# Patient Record
Sex: Female | Born: 1985 | Race: Black or African American | Hispanic: No | Marital: Single | Smoking: Former smoker
Health system: Southern US, Community
[De-identification: ages and names within clinical notes are randomized; demographics above are authoritative.]

## PROBLEM LIST (undated history)

## (undated) DIAGNOSIS — A599 Trichomoniasis, unspecified: Secondary | ICD-10-CM

## (undated) DIAGNOSIS — J40 Bronchitis, not specified as acute or chronic: Secondary | ICD-10-CM

## (undated) DIAGNOSIS — R51 Headache: Secondary | ICD-10-CM

## (undated) DIAGNOSIS — Z8619 Personal history of other infectious and parasitic diseases: Secondary | ICD-10-CM

## (undated) DIAGNOSIS — A749 Chlamydial infection, unspecified: Secondary | ICD-10-CM

## (undated) HISTORY — PX: NO PAST SURGERIES: SHX2092

---
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
## 2008-03-16 ENCOUNTER — Emergency Department (HOSPITAL_COMMUNITY): Admission: EM | Admit: 2008-03-16 | Discharge: 2008-03-16 | Payer: Self-pay | Admitting: Emergency Medicine

## 2008-05-09 ENCOUNTER — Emergency Department (HOSPITAL_COMMUNITY): Admission: EM | Admit: 2008-05-09 | Discharge: 2008-05-09 | Payer: Self-pay | Admitting: Emergency Medicine

## 2009-08-10 ENCOUNTER — Emergency Department (HOSPITAL_COMMUNITY): Admission: EM | Admit: 2009-08-10 | Discharge: 2009-08-11 | Payer: Self-pay | Admitting: Emergency Medicine

## 2010-05-30 ENCOUNTER — Emergency Department (HOSPITAL_COMMUNITY): Admission: EM | Admit: 2010-05-30 | Discharge: 2010-05-30 | Payer: Self-pay | Admitting: Emergency Medicine

## 2010-09-22 ENCOUNTER — Emergency Department (HOSPITAL_COMMUNITY): Admission: EM | Admit: 2010-09-22 | Discharge: 2010-05-18 | Payer: Self-pay | Admitting: Emergency Medicine

## 2010-09-23 ENCOUNTER — Emergency Department (HOSPITAL_COMMUNITY)
Admission: EM | Admit: 2010-09-23 | Discharge: 2010-09-23 | Payer: Self-pay | Source: Home / Self Care | Admitting: Emergency Medicine

## 2010-10-16 NOTE — L&D Delivery Note (Signed)
Delivery Note At 4:21 AM a viable female was delivered via Vaginal, Spontaneous Delivery (Presentation: Left Occiput Anterior).  APGAR: 9, 9; weight 7 lb 2 oz (3232 g).   Placenta status: Intact, Spontaneous.  Cord: 3 vessels with the following complications: none.  Anesthesia: Epidural  Episiotomy: none Lacerations: 2nd degree Suture Repair: 2.0 3.0 Est. Blood Loss (mL): 300 ml  Mom to postpartum.  Baby to nursery-stable.  JACKSON-MOORE,Jacorie Ernsberger A 08/21/2011, 5:10 AM

## 2010-12-26 LAB — STREP A DNA PROBE

## 2011-01-19 LAB — URINE MICROSCOPIC-ADD ON

## 2011-01-19 LAB — URINE CULTURE: Colony Count: >=100000

## 2011-01-19 LAB — URINALYSIS, ROUTINE W REFLEX MICROSCOPIC
Glucose, UA: NEGATIVE mg/dL
Ketones, ur: NEGATIVE mg/dL
Nitrite: NEGATIVE
Protein, ur: 100 mg/dL — AB

## 2011-01-19 LAB — POCT PREGNANCY, URINE: Preg Test, Ur: NEGATIVE

## 2011-02-14 ENCOUNTER — Other Ambulatory Visit: Payer: Self-pay

## 2011-02-14 LAB — ABO/RH: RH Type: POSITIVE

## 2011-02-23 ENCOUNTER — Emergency Department (HOSPITAL_COMMUNITY)
Admission: EM | Admit: 2011-02-23 | Discharge: 2011-02-23 | Disposition: A | Discharge status: Home / Self Care | Payer: Medicaid Other | Attending: Emergency Medicine | Admitting: Emergency Medicine

## 2011-02-23 DIAGNOSIS — R51 Headache: Secondary | ICD-10-CM | POA: Insufficient documentation

## 2011-02-23 DIAGNOSIS — O99891 Other specified diseases and conditions complicating pregnancy: Secondary | ICD-10-CM | POA: Insufficient documentation

## 2011-02-23 DIAGNOSIS — O210 Mild hyperemesis gravidarum: Secondary | ICD-10-CM | POA: Insufficient documentation

## 2011-02-23 LAB — COMPREHENSIVE METABOLIC PANEL
AST: 16 U/L (ref 0–37)
CO2: 22 mEq/L (ref 19–32)
Calcium: 10.5 mg/dL (ref 8.4–10.5)
Chloride: 100 mEq/L (ref 96–112)
Creatinine, Ser: 0.5 mg/dL (ref 0.4–1.2)
GFR calc Af Amer: >60 mL/min (ref >60)
GFR calc non Af Amer: >60 mL/min (ref >60)
Glucose, Bld: 75 mg/dL (ref 70–99)
Total Bilirubin: 0.2 mg/dL — ABNORMAL LOW (ref 0.3–1.2)

## 2011-02-23 LAB — URINALYSIS, ROUTINE W REFLEX MICROSCOPIC
Bilirubin Urine: NEGATIVE
Hgb urine dipstick: NEGATIVE
Ketones, ur: NEGATIVE mg/dL
Nitrite: NEGATIVE
Protein, ur: NEGATIVE mg/dL
Urobilinogen, UA: 0.2 mg/dL (ref 0.0–1.0)

## 2011-03-28 DIAGNOSIS — M549 Dorsalgia, unspecified: Secondary | ICD-10-CM

## 2011-03-28 DIAGNOSIS — O26899 Other specified pregnancy related conditions, unspecified trimester: Secondary | ICD-10-CM

## 2011-07-06 ENCOUNTER — Inpatient Hospital Stay (HOSPITAL_COMMUNITY)
Admission: AD | Admit: 2011-07-06 | Discharge: 2011-07-06 | Disposition: A | Discharge status: Home / Self Care | Payer: Medicaid Other | Source: Ambulatory Visit | Attending: Obstetrics & Gynecology | Admitting: Obstetrics & Gynecology

## 2011-07-06 ENCOUNTER — Encounter (HOSPITAL_COMMUNITY): Payer: Self-pay | Admitting: *Deleted

## 2011-07-06 DIAGNOSIS — R109 Unspecified abdominal pain: Secondary | ICD-10-CM | POA: Insufficient documentation

## 2011-07-06 DIAGNOSIS — O99891 Other specified diseases and conditions complicating pregnancy: Secondary | ICD-10-CM | POA: Insufficient documentation

## 2011-07-06 DIAGNOSIS — M549 Dorsalgia, unspecified: Secondary | ICD-10-CM | POA: Insufficient documentation

## 2011-07-06 HISTORY — DX: Headache: R51

## 2011-07-06 HISTORY — DX: Personal history of other infectious and parasitic diseases: Z86.19

## 2011-07-06 HISTORY — DX: Trichomoniasis, unspecified: A59.9

## 2011-07-06 LAB — URINALYSIS, ROUTINE W REFLEX MICROSCOPIC
Bilirubin Urine: NEGATIVE
Ketones, ur: NEGATIVE mg/dL
Leukocytes, UA: NEGATIVE
Nitrite: NEGATIVE
Specific Gravity, Urine: 1.01 (ref 1.005–1.030)
Urobilinogen, UA: 0.2 mg/dL (ref 0.0–1.0)

## 2011-07-06 MED ORDER — CYCLOBENZAPRINE HCL 10 MG PO TABS: 10.0000 mg | ORAL_TABLET | Freq: Three times a day (TID) | ORAL | Status: AC | PRN

## 2011-07-06 MED ORDER — ACETAMINOPHEN-CODEINE #3 300-30 MG PO TABS: 1.0000 | ORAL_TABLET | ORAL | Status: AC | PRN

## 2011-07-06 NOTE — ED Provider Notes (Signed)
History     Chief Complaint  Patient presents with  . Abdominal Pain   HPI Right side pain since 1700 tonight, worse with sitting straight up. Currently in left lateral position and not feeling pain at all.   OB History    Grav Para Term Preterm Abortions TAB SAB Ect Mult Living   1               Past Medical History  Diagnosis Date  . History of chlamydia infection     Treated  . Trichomonas infection     Treated  . Headache   . No pertinent past medical history     Past Surgical History  Procedure Date  . Cosmetic surgery 1997    Facial laceration repair   . No past surgeries     No family history on file.  History  Substance Use Topics  . Smoking status: Former Smoker -- 5 years    Types: Cigarettes    Quit date: 01/03/2011  . Smokeless tobacco: Not on file  . Alcohol Use: No     Prepregnancy    Allergies: No Known Allergies  Prescriptions prior to admission  Medication Sig Dispense Refill  . fluconazole (DIFLUCAN) 200 MG tablet Take 200 mg by mouth daily.        Marland Kitchen omeprazole (PRILOSEC) 20 MG capsule Take 20 mg by mouth daily.        . prenatal vitamin w/FE, FA (PRENATAL 1 + 1) 27-1 MG TABS Take 1 tablet by mouth daily.          Review of Systems  Constitutional: Negative.   Respiratory: Negative.   Cardiovascular: Negative.   Gastrointestinal: Negative for nausea, vomiting, abdominal pain, diarrhea and constipation.  Genitourinary: Negative for dysuria, urgency, frequency, hematuria and flank pain.       Negative for vaginal bleeding, cramping/contractions  Musculoskeletal: Positive for back pain.  Neurological: Negative.   Psychiatric/Behavioral: Negative.    Physical Exam   Blood pressure 108/49, pulse 85, temperature 98.3 F (36.8 C), temperature source Oral, resp. rate 16, height 5\' 5"  (1.651 m), weight 97.07 kg (214 lb), last menstrual period 11/11/2010.  Physical Exam  Nursing note and vitals reviewed. Constitutional: She is oriented  to person, place, and time. She appears well-developed and well-nourished. No distress.  HENT:  Head: Normocephalic and atraumatic.  Cardiovascular: Normal rate.   Respiratory: Effort normal.  GI: Soft. Bowel sounds are normal. She exhibits no mass. There is no tenderness. There is no rebound, no guarding and no CVA tenderness.  Genitourinary: There is no rash or lesion on the right labia. There is no rash or lesion on the left labia. Uterus is not tender. Enlarged: Size c/w dates. Cervix exhibits no motion tenderness, no discharge and no friability. Right adnexum displays no mass, no tenderness and no fullness. Left adnexum displays no mass, no tenderness and no fullness. No tenderness or bleeding around the vagina. No vaginal discharge found.  Musculoskeletal: Normal range of motion.  Neurological: She is alert and oriented to person, place, and time.  Skin: Skin is warm and dry.  Psychiatric: She has a normal mood and affect.    MAU Course  Procedures Results for orders placed during the hospital encounter of 07/06/11 (from the past 24 hour(s))  URINALYSIS, ROUTINE W REFLEX MICROSCOPIC     Status: Normal   Collection Time   07/06/11  7:35 PM      Component Value Range   Color, Urine YELLOW  YELLOW    Appearance CLEAR  CLEAR    Specific Gravity, Urine 1.010  1.005 - 1.030    pH 6.5  5.0 - 8.0    Glucose, UA NEGATIVE  NEGATIVE (mg/dL)   Hgb urine dipstick NEGATIVE  NEGATIVE    Bilirubin Urine NEGATIVE  NEGATIVE    Ketones, ur NEGATIVE  NEGATIVE (mg/dL)   Protein, ur NEGATIVE  NEGATIVE (mg/dL)   Urobilinogen, UA 0.2  0.0 - 1.0 (mg/dL)   Nitrite NEGATIVE  NEGATIVE    Leukocytes, UA NEGATIVE  NEGATIVE      Assessment and Plan  25 y.o. G1P0 at [redacted]w[redacted]d Back pain - likely MSK in origin - rx tylenol #3 and Flexeril, rev'd comfort measures and stretches Precautions rev'd F/u as scheduled  Alasdair Kleve 07/06/2011, 8:57 PM

## 2011-07-06 NOTE — Progress Notes (Signed)
Pt reports R side pain that radiates to her back and abdomen at times. Pt stated that pain started 17:00. Pt rates pain @ 5/10 now, but was up to an 8/10 at times.

## 2011-07-14 LAB — URINALYSIS, ROUTINE W REFLEX MICROSCOPIC
Bilirubin Urine: NEGATIVE
Hgb urine dipstick: NEGATIVE
Nitrite: NEGATIVE
Protein, ur: 100 — AB
Urobilinogen, UA: 0.2

## 2011-07-14 LAB — POCT PREGNANCY, URINE
Operator id: 24446
Preg Test, Ur: NEGATIVE

## 2011-07-18 LAB — STREP B DNA PROBE: GBS: POSITIVE

## 2011-07-26 ENCOUNTER — Ambulatory Visit: Payer: Medicaid Other | Admitting: Cardiovascular Disease

## 2011-08-03 ENCOUNTER — Other Ambulatory Visit: Payer: Self-pay | Admitting: Obstetrics & Gynecology

## 2011-08-03 DIAGNOSIS — IMO0002 Reserved for concepts with insufficient information to code with codable children: Secondary | ICD-10-CM

## 2011-08-04 ENCOUNTER — Ambulatory Visit (HOSPITAL_COMMUNITY)
Admission: RE | Admit: 2011-08-04 | Discharge: 2011-08-04 | Disposition: A | Discharge status: Home / Self Care | Payer: Medicaid Other | Source: Ambulatory Visit | Attending: Obstetrics & Gynecology | Admitting: Obstetrics & Gynecology

## 2011-08-04 ENCOUNTER — Encounter (HOSPITAL_COMMUNITY): Payer: Self-pay

## 2011-08-04 DIAGNOSIS — O368390 Maternal care for abnormalities of the fetal heart rate or rhythm, unspecified trimester, not applicable or unspecified: Secondary | ICD-10-CM | POA: Insufficient documentation

## 2011-08-04 DIAGNOSIS — IMO0002 Reserved for concepts with insufficient information to code with codable children: Secondary | ICD-10-CM

## 2011-08-04 DIAGNOSIS — Z363 Encounter for antenatal screening for malformations: Secondary | ICD-10-CM | POA: Insufficient documentation

## 2011-08-04 DIAGNOSIS — Z1389 Encounter for screening for other disorder: Secondary | ICD-10-CM | POA: Insufficient documentation

## 2011-08-04 DIAGNOSIS — O358XX0 Maternal care for other (suspected) fetal abnormality and damage, not applicable or unspecified: Secondary | ICD-10-CM | POA: Insufficient documentation

## 2011-08-20 ENCOUNTER — Encounter (HOSPITAL_COMMUNITY): Payer: Self-pay

## 2011-08-20 ENCOUNTER — Inpatient Hospital Stay (HOSPITAL_COMMUNITY)
Admission: AD | Admit: 2011-08-20 | Discharge: 2011-08-20 | Disposition: A | Discharge status: Home / Self Care | Payer: Medicaid Other | Source: Ambulatory Visit | Attending: Obstetrics & Gynecology | Admitting: Obstetrics & Gynecology

## 2011-08-20 DIAGNOSIS — O479 False labor, unspecified: Secondary | ICD-10-CM | POA: Insufficient documentation

## 2011-08-20 HISTORY — DX: Bronchitis, not specified as acute or chronic: J40

## 2011-08-20 HISTORY — DX: Chlamydial infection, unspecified: A74.9

## 2011-08-20 NOTE — Progress Notes (Signed)
Patient is here with c/o ctx q34m since 1am and no fetal movement. Denies lof, vaginal bleeding or discharge.

## 2011-08-20 NOTE — Progress Notes (Signed)
Pt states, " I've had contractions off and on for a couple days, but since 0150 they have been regular and stronger."

## 2011-08-21 ENCOUNTER — Encounter (HOSPITAL_COMMUNITY): Payer: Self-pay | Admitting: Anesthesiology

## 2011-08-21 ENCOUNTER — Inpatient Hospital Stay (HOSPITAL_COMMUNITY)
Admission: AD | Admit: 2011-08-21 | Discharge: 2011-08-23 | DRG: 775 | Disposition: A | Discharge status: Home / Self Care | Payer: Medicaid Other | Source: Ambulatory Visit | Attending: Obstetrics & Gynecology | Admitting: Obstetrics & Gynecology

## 2011-08-21 ENCOUNTER — Encounter (HOSPITAL_COMMUNITY): Payer: Self-pay | Admitting: *Deleted

## 2011-08-21 ENCOUNTER — Inpatient Hospital Stay (HOSPITAL_COMMUNITY): Payer: Medicaid Other | Admitting: Anesthesiology

## 2011-08-21 DIAGNOSIS — O99892 Other specified diseases and conditions complicating childbirth: Secondary | ICD-10-CM | POA: Diagnosis present

## 2011-08-21 DIAGNOSIS — IMO0001 Reserved for inherently not codable concepts without codable children: Secondary | ICD-10-CM

## 2011-08-21 DIAGNOSIS — Z22330 Carrier of Group B streptococcus: Secondary | ICD-10-CM

## 2011-08-21 LAB — CBC
HCT: 40.8 % (ref 36.0–46.0)
MCH: 29.3 pg (ref 26.0–34.0)
MCV: 87.4 fL (ref 78.0–100.0)
Platelets: 186 10*3/uL (ref 150–400)
RBC: 4.67 MIL/uL (ref 3.87–5.11)
WBC: 10.7 10*3/uL — ABNORMAL HIGH (ref 4.0–10.5)

## 2011-08-21 MED ORDER — BENZOCAINE-MENTHOL 20-0.5 % EX AERO
INHALATION_SPRAY | CUTANEOUS | Status: AC
  Administered 2011-08-21: 1 via TOPICAL
  Filled 2011-08-21: qty 56

## 2011-08-21 MED ORDER — WITCH HAZEL-GLYCERIN EX PADS: 1.0000 "application " | MEDICATED_PAD | CUTANEOUS | Status: DC | PRN

## 2011-08-21 MED ORDER — FENTANYL 2.5 MCG/ML BUPIVACAINE 1/10 % EPIDURAL INFUSION (WH - ANES)
14.0000 mL/h | INTRAMUSCULAR | Status: DC
  Administered 2011-08-21: 14 mL/h via EPIDURAL
  Filled 2011-08-21: qty 60

## 2011-08-21 MED ORDER — TETANUS-DIPHTH-ACELL PERTUSSIS 5-2.5-18.5 LF-MCG/0.5 IM SUSP
0.5000 mL | Freq: Once | INTRAMUSCULAR | Status: AC
  Administered 2011-08-22: 0.5 mL via INTRAMUSCULAR
  Filled 2011-08-21: qty 0.5

## 2011-08-21 MED ORDER — DIBUCAINE 1 % RE OINT: 1.0000 "application " | TOPICAL_OINTMENT | RECTAL | Status: DC | PRN

## 2011-08-21 MED ORDER — FERROUS SULFATE 325 (65 FE) MG PO TABS
325.0000 mg | ORAL_TABLET | Freq: Two times a day (BID) | ORAL | Status: DC
  Administered 2011-08-21 – 2011-08-23 (×5): 325 mg via ORAL
  Filled 2011-08-21 (×5): qty 1

## 2011-08-21 MED ORDER — EPHEDRINE 5 MG/ML INJ
10.0000 mg | INTRAVENOUS | Status: DC | PRN
  Filled 2011-08-21 (×2): qty 4

## 2011-08-21 MED ORDER — ZOLPIDEM TARTRATE 5 MG PO TABS: 5.0000 mg | ORAL_TABLET | Freq: Every evening | ORAL | Status: DC | PRN

## 2011-08-21 MED ORDER — MAGNESIUM HYDROXIDE 400 MG/5ML PO SUSP: 30.0000 mL | ORAL | Status: DC | PRN

## 2011-08-21 MED ORDER — LANOLIN HYDROUS EX OINT: TOPICAL_OINTMENT | CUTANEOUS | Status: DC | PRN

## 2011-08-21 MED ORDER — ONDANSETRON HCL 4 MG/2ML IJ SOLN: 4.0000 mg | INTRAMUSCULAR | Status: DC | PRN

## 2011-08-21 MED ORDER — PENICILLIN G POTASSIUM 5000000 UNITS IJ SOLR
2.5000 10*6.[IU] | INTRAVENOUS | Status: DC
  Filled 2011-08-21 (×3): qty 2.5

## 2011-08-21 MED ORDER — DIPHENHYDRAMINE HCL 50 MG/ML IJ SOLN: 12.5000 mg | INTRAMUSCULAR | Status: DC | PRN

## 2011-08-21 MED ORDER — LACTATED RINGERS IV SOLN: 500.0000 mL | Freq: Once | INTRAVENOUS | Status: DC

## 2011-08-21 MED ORDER — IBUPROFEN 600 MG PO TABS
600.0000 mg | ORAL_TABLET | Freq: Four times a day (QID) | ORAL | Status: DC
  Administered 2011-08-21 – 2011-08-23 (×9): 600 mg via ORAL
  Filled 2011-08-21 (×10): qty 1

## 2011-08-21 MED ORDER — MEDROXYPROGESTERONE ACETATE 150 MG/ML IM SUSP: 150.0000 mg | INTRAMUSCULAR | Status: DC | PRN

## 2011-08-21 MED ORDER — LACTATED RINGERS IV SOLN: INTRAVENOUS | Status: DC

## 2011-08-21 MED ORDER — LIDOCAINE HCL (PF) 1 % IJ SOLN
30.0000 mL | INTRAMUSCULAR | Status: DC | PRN
  Filled 2011-08-21 (×2): qty 30

## 2011-08-21 MED ORDER — DIPHENHYDRAMINE HCL 25 MG PO CAPS: 25.0000 mg | ORAL_CAPSULE | Freq: Four times a day (QID) | ORAL | Status: DC | PRN

## 2011-08-21 MED ORDER — MEASLES, MUMPS & RUBELLA VAC ~~LOC~~ INJ
0.5000 mL | INJECTION | Freq: Once | SUBCUTANEOUS | Status: DC
  Filled 2011-08-21: qty 0.5

## 2011-08-21 MED ORDER — LIDOCAINE HCL 1.5 % IJ SOLN
INTRAMUSCULAR | Status: DC | PRN
  Administered 2011-08-21 (×2): 5 mL via EPIDURAL

## 2011-08-21 MED ORDER — ONDANSETRON HCL 4 MG PO TABS: 4.0000 mg | ORAL_TABLET | ORAL | Status: DC | PRN

## 2011-08-21 MED ORDER — BUTORPHANOL TARTRATE 2 MG/ML IJ SOLN
1.0000 mg | INTRAMUSCULAR | Status: DC | PRN
  Administered 2011-08-21: 01:00:00 via INTRAVENOUS
  Filled 2011-08-21: qty 1

## 2011-08-21 MED ORDER — BENZOCAINE-MENTHOL 20-0.5 % EX AERO
1.0000 "application " | INHALATION_SPRAY | CUTANEOUS | Status: DC | PRN
  Administered 2011-08-21: 1 via TOPICAL

## 2011-08-21 MED ORDER — ACETAMINOPHEN 325 MG PO TABS: 650.0000 mg | ORAL_TABLET | ORAL | Status: DC | PRN

## 2011-08-21 MED ORDER — PENICILLIN G POTASSIUM 5000000 UNITS IJ SOLR
5.0000 10*6.[IU] | Freq: Once | INTRAVENOUS | Status: AC
  Administered 2011-08-21: 5 10*6.[IU] via INTRAVENOUS
  Filled 2011-08-21: qty 5

## 2011-08-21 MED ORDER — OXYCODONE-ACETAMINOPHEN 5-325 MG PO TABS: 2.0000 | ORAL_TABLET | ORAL | Status: DC | PRN

## 2011-08-21 MED ORDER — OXYCODONE-ACETAMINOPHEN 5-325 MG PO TABS
1.0000 | ORAL_TABLET | ORAL | Status: DC | PRN
  Administered 2011-08-21 – 2011-08-22 (×5): 1 via ORAL
  Filled 2011-08-21 (×5): qty 1

## 2011-08-21 MED ORDER — LACTATED RINGERS IV SOLN: 500.0000 mL | INTRAVENOUS | Status: DC | PRN

## 2011-08-21 MED ORDER — PHENYLEPHRINE 40 MCG/ML (10ML) SYRINGE FOR IV PUSH (FOR BLOOD PRESSURE SUPPORT)
80.0000 ug | PREFILLED_SYRINGE | INTRAVENOUS | Status: DC | PRN
  Filled 2011-08-21: qty 5

## 2011-08-21 MED ORDER — PRENATAL PLUS 27-1 MG PO TABS
1.0000 | ORAL_TABLET | Freq: Every day | ORAL | Status: DC
  Administered 2011-08-21 – 2011-08-23 (×3): 1 via ORAL
  Filled 2011-08-21 (×3): qty 1

## 2011-08-21 MED ORDER — IBUPROFEN 600 MG PO TABS: 600.0000 mg | ORAL_TABLET | Freq: Four times a day (QID) | ORAL | Status: DC | PRN

## 2011-08-21 MED ORDER — BUTORPHANOL TARTRATE 2 MG/ML IJ SOLN
INTRAMUSCULAR | Status: AC
  Filled 2011-08-21: qty 1

## 2011-08-21 MED ORDER — SENNOSIDES-DOCUSATE SODIUM 8.6-50 MG PO TABS
2.0000 | ORAL_TABLET | Freq: Every day | ORAL | Status: DC
  Administered 2011-08-21 – 2011-08-22 (×2): 2 via ORAL

## 2011-08-21 MED ORDER — OXYTOCIN BOLUS FROM INFUSION
500.0000 mL | Freq: Once | INTRAVENOUS | Status: DC
  Filled 2011-08-21: qty 500

## 2011-08-21 MED ORDER — PHENYLEPHRINE 40 MCG/ML (10ML) SYRINGE FOR IV PUSH (FOR BLOOD PRESSURE SUPPORT)
80.0000 ug | PREFILLED_SYRINGE | INTRAVENOUS | Status: DC | PRN
Start: 2011-08-21 — End: 2011-08-21
  Filled 2011-08-21 (×2): qty 5

## 2011-08-21 MED ORDER — FLEET ENEMA 7-19 GM/118ML RE ENEM: 1.0000 | ENEMA | RECTAL | Status: DC | PRN

## 2011-08-21 MED ORDER — CITRIC ACID-SODIUM CITRATE 334-500 MG/5ML PO SOLN: 30.0000 mL | ORAL | Status: DC | PRN

## 2011-08-21 MED ORDER — ONDANSETRON HCL 4 MG/2ML IJ SOLN: 4.0000 mg | Freq: Four times a day (QID) | INTRAMUSCULAR | Status: DC | PRN

## 2011-08-21 MED ORDER — EPHEDRINE 5 MG/ML INJ
10.0000 mg | INTRAVENOUS | Status: DC | PRN
  Filled 2011-08-21: qty 4

## 2011-08-21 MED ORDER — OXYTOCIN 20 UNITS IN LACTATED RINGERS INFUSION - SIMPLE
125.0000 mL/h | Freq: Once | INTRAVENOUS | Status: AC
  Administered 2011-08-21: 125 mL/h via INTRAVENOUS
  Filled 2011-08-21: qty 1000

## 2011-08-21 NOTE — Anesthesia Postprocedure Evaluation (Signed)
  Anesthesia Post-op Note  Patient: Michelle Kaiser  Procedure(s) Performed: * No procedures listed *  Patient Location: Mother/Baby  Anesthesia Type: Epidural  Level of Consciousness: awake and alert oriented  Airway and Oxygen Therapy: Patient Spontanous Breathing  Post-op Pain: none  Post-op Assessment: Post-op Vital signs reviewed, Patient's Cardiovascular Status Stable, No headache, No backache, No residual numbness and No residual motor weakness  Post-op Vital Signs: Reviewed and stable  Complications: No apparent anesthesia complications

## 2011-08-21 NOTE — Progress Notes (Signed)
UR chart review completed.  

## 2011-08-21 NOTE — Progress Notes (Signed)
Dr. Tamela Oddi notified of pt presenting for labor check.  Notified of + fern and VE with GBS+. Admit orders received.

## 2011-08-21 NOTE — H&P (Signed)
Michelle Kaiser is a 25 y.o. female presenting for contractions. Maternal Medical History:  Reason for admission: Reason for admission: contractions.  Contractions: Frequency: regular.   Perceived severity is strong.    Fetal activity: Perceived fetal activity is normal.    Prenatal complications: no prenatal complications   OB History    Grav Para Term Preterm Abortions TAB SAB Ect Mult Living   1 1 1  0 0 0 0 0 0 0     Past Medical History  Diagnosis Date  . History of chlamydia infection     Treated  . Trichomonas infection     Treated  . Headache   . Chlamydia   . Bronchitis    Past Surgical History  Procedure Date  . Cosmetic surgery 1997    Facial laceration repair   . No past surgeries    Family History: family history is not on file. Social History:  reports that she quit smoking about 7 months ago. Her smoking use included Cigarettes. She quit after 5 years of use. She does not have any smokeless tobacco history on file. She reports that she does not drink alcohol or use illicit drugs.  Review of Systems  Constitutional: Negative for fever.  Eyes: Negative for blurred vision.  Respiratory: Negative for shortness of breath.   Gastrointestinal: Negative for vomiting.  Skin: Negative for rash.  Neurological: Negative for headaches.    Dilation:  (SVD) Effacement (%): 100 Station: +3 Exam by:: [email or web address] Blood pressure 126/78, pulse 100, temperature 98 F (36.7 C), temperature source Oral, resp. rate 18, height 5\' 5"  (1.651 m), weight 104.781 kg (231 lb), last menstrual period 11/11/2010, SpO2 98.00%, unknown if currently breastfeeding. Maternal Exam:  Uterine Assessment: Contraction strength is firm.  Contraction frequency is regular.   Abdomen: Patient reports no abdominal tenderness. Fetal presentation: vertex  Introitus: not evaluated.     Fetal Exam Fetal Monitor Review: Variability: moderate (6-25 bpm).   Pattern: accelerations present.    Fetal  State Assessment: Category I - tracings are normal.     Physical Exam  Constitutional: She appears well-developed.  HENT:  Head: Normocephalic.  Neck: Neck supple. No thyromegaly present.  Cardiovascular: Normal rate and regular rhythm.   Respiratory: Breath sounds normal.  GI: Soft. Bowel sounds are normal.  Skin: No rash noted.    Prenatal labs: ABO, Rh: O/Positive/-- (05/01 0000) Antibody:   Rubella: Immune (05/01 0000) RPR:    HBsAg: Negative (05/01 0000)  HIV: Non-reactive (05/01 0000)  GBS: Positive (10/02 0000)   Assessment/Plan: 25 y.o. with an IUP @ [redacted]w[redacted]d in active labor  Admit Anticipate NSVD   JACKSON-MOORE,Jesusa Stenerson A 08/21/2011, 5:14 AM

## 2011-08-21 NOTE — Progress Notes (Signed)
Pt G1 at 40.3wks contracting all day and having bloody show.  11/4 SVE 1cm.  Denies problems with pregnancy.

## 2011-08-21 NOTE — Progress Notes (Signed)
Pt may go to room 167. 

## 2011-08-21 NOTE — Anesthesia Procedure Notes (Signed)
Epidural Patient location during procedure: OB Start time: 08/21/2011 2:25 AM  Staffing Performed by: anesthesiologist   Preanesthetic Checklist Completed: patient identified, site marked, surgical consent, pre-op evaluation, timeout performed, IV checked, risks and benefits discussed and monitors and equipment checked  Epidural Patient position: sitting Prep: site prepped and draped and DuraPrep Patient monitoring: continuous pulse ox and blood pressure Approach: midline Injection technique: LOR air and LOR saline  Needle:  Needle type: Tuohy  Needle gauge: 17 G Needle length: 9 cm Needle insertion depth: 9 cm Catheter type: closed end flexible Catheter size: 19 Gauge Catheter at skin depth: 14 cm Test dose: negative  Assessment Events: blood not aspirated, injection not painful, no injection resistance, negative IV test and no paresthesia  Additional Notes Patient identified.  Risk benefits discussed including failed block, incomplete pain control, headache, nerve damage, paralysis, blood pressure changes, nausea, vomiting, reactions to medication both toxic or allergic, and postpartum back pain.  Patient expressed understanding and wished to proceed.  All questions were answered.  Sterile technique used throughout procedure and epidural site dressed with sterile barrier dressing. No paresthesia or other complications noted.The patient did not experience any signs of intravascular injection such as tinnitus or metallic taste in mouth nor signs of intrathecal spread such as rapid motor block. Please see nursing notes for vital signs.

## 2011-08-21 NOTE — Anesthesia Preprocedure Evaluation (Signed)
Anesthesia Evaluation  Patient identified by MRN, date of birth, ID band Patient awake    Reviewed: Allergy & Precautions, H&P , Patient's Chart, lab work & pertinent test results  Airway Mallampati: IV TM Distance: >3 FB Neck ROM: full    Dental No notable dental hx.    Pulmonary neg pulmonary ROS,  clear to auscultation  Pulmonary exam normal       Cardiovascular neg cardio ROS regular Normal    Neuro/Psych Negative Neurological ROS  Negative Psych ROS   GI/Hepatic negative GI ROS, Neg liver ROS,   Endo/Other  Negative Endocrine ROS  Renal/GU negative Renal ROS     Musculoskeletal   Abdominal   Peds  Hematology negative hematology ROS (+)   Anesthesia Other Findings   Reproductive/Obstetrics (+) Pregnancy                           Anesthesia Physical Anesthesia Plan  ASA: III  Anesthesia Plan: Epidural   Post-op Pain Management:    Induction:   Airway Management Planned:   Additional Equipment:   Intra-op Plan:   Post-operative Plan:   Informed Consent: I have reviewed the patients History and Physical, chart, labs and discussed the procedure including the risks, benefits and alternatives for the proposed anesthesia with the patient or authorized representative who has indicated his/her understanding and acceptance.     Plan Discussed with:   Anesthesia Plan Comments:         Anesthesia Quick Evaluation

## 2011-08-22 LAB — CBC
HCT: 34.9 % — ABNORMAL LOW (ref 36.0–46.0)
MCH: 29 pg (ref 26.0–34.0)
MCV: 88.1 fL (ref 78.0–100.0)
Platelets: 179 10*3/uL (ref 150–400)
RBC: 3.96 MIL/uL (ref 3.87–5.11)

## 2011-08-22 NOTE — Progress Notes (Signed)
Post Partum Day 1 S/P spontaneous vaginal RH status/Rubella reviewed.  Feeding: unknown Subjective: No HA, SOB, CP, F/C, breast symptoms. Normal vaginal bleeding, no clots.     Objective: BP 118/79  Pulse 76  Temp(Src) 97.9 F (36.6 C) (Oral)  Resp 18  Ht 5\' 5"  (1.651 m)  Wt 104.781 kg (231 lb)  BMI 38.44 kg/m2  SpO2 98%  LMP 11/11/2010  Breastfeeding? Unknown   Physical Exam:  General: alert Lochia: appropriate Uterine Fundus: firm DVT Evaluation: No evidence of DVT seen on physical exam. Ext: No c/c/e  Basename 08/22/11 0600 08/21/11 0051  HGB 11.5* 13.7  HCT 34.9* 40.8      Assessment/Plan: 25 y.o.  PPD #1 .  normal postpartum exam Continue current postpartum care  Ambulate   LOS: 1 day   JACKSON-MOORE,Zionna Homewood A 08/22/2011, 10:17 AM

## 2011-08-23 MED ORDER — IBUPROFEN 600 MG PO TABS: 600.0000 mg | ORAL_TABLET | Freq: Four times a day (QID) | ORAL | Status: AC

## 2011-08-23 NOTE — Progress Notes (Signed)
Post Partum Day #2 S/P:spontaneous vaginal  RH status/Rubella reviewed.  Feeding: breast Subjective: No HA, SOB, CP, F/C, breast symptoms: no. Normal vaginal bleeding, no clots.     Objective:  Blood pressure 109/76, pulse 77, temperature 97.9 F (36.6 C), temperature source Oral, resp. rate 18, height 5\' 5"  (1.651 m), weight 104.781 kg (231 lb), last menstrual period 11/11/2010, SpO2 99.00%, unknown if currently breastfeeding.   Physical Exam:  General: alert Lochia: appropriate Uterine Fundus: firm DVT Evaluation: No evidence of DVT seen on physical exam. Ext: No c/c/e  Basename 08/22/11 0600 08/21/11 0051  HGB 11.5* 13.7  HCT 34.9* 40.8    Assessment/Plan: 25 y.o.  PPD # 2 .  normal postpartum exam Continue current postpartum care D/C home   LOS: 2 days   JACKSON-MOORE,Angeligue Bowne A 08/23/2011, 9:42 AM

## 2011-08-23 NOTE — Discharge Summary (Signed)
  Obstetric Discharge Summary Reason for Admission: onset of labor Prenatal Procedures: none Intrapartum Procedures: spontaneous vaginal delivery Postpartum Procedures: none Complications-Operative and Postpartum: none  Hemoglobin  Date Value Range Status  08/22/2011 11.5* 12.0-15.0 (g/dL) Final     HCT  Date Value Range Status  08/22/2011 34.9* 36.0-46.0 (%) Final    Discharge Diagnoses: Term Pregnancy-delivered  Discharge Information: Date: 08/23/2011 Activity: pelvic rest Diet: routine Medications:  Prior to Admission medications   Medication Sig Start Date End Date Taking? Authorizing Provider  prenatal vitamin w/FE, FA (PRENATAL 1 + 1) 27-1 MG TABS Take 1 tablet by mouth daily.     Yes Historical Provider, MD  ibuprofen (ADVIL,MOTRIN) 600 MG tablet Take 1 tablet (600 mg total) by mouth every 6 (six) hours. 08/23/11 09/02/11  Roseanna Rainbow, MD    Condition: stable Instructions: refer to routine discharge instructions Discharge to: home Follow-up Information    Follow up with JACKSON-MOORE,Idara Woodside A, MD. Call in 6 weeks.   Contact information:   [address], [address] [place] [address] [phone]          Newborn Data: Live born  Information for the patient's newborn:  Kharter, Sestak Girl Ivon [[id number]]  female ; APGAR , ; weight ;  Home with mother.  JACKSON-MOORE,Breelynn Bankert A 08/23/2011, 9:53 AM

## 2011-09-25 LAB — US OB COMP + 14 WK

## 2013-09-29 ENCOUNTER — Emergency Department (HOSPITAL_COMMUNITY)
Admission: EM | Admit: 2013-09-29 | Discharge: 2013-09-29 | Disposition: A | Discharge status: Home / Self Care | Payer: Medicaid Other | Attending: Emergency Medicine | Admitting: Emergency Medicine

## 2013-09-29 ENCOUNTER — Encounter (HOSPITAL_COMMUNITY): Payer: Self-pay | Admitting: Emergency Medicine

## 2013-09-29 DIAGNOSIS — D170 Benign lipomatous neoplasm of skin and subcutaneous tissue of head, face and neck: Secondary | ICD-10-CM | POA: Insufficient documentation

## 2013-09-29 DIAGNOSIS — Z87891 Personal history of nicotine dependence: Secondary | ICD-10-CM | POA: Insufficient documentation

## 2013-09-29 DIAGNOSIS — R51 Headache: Secondary | ICD-10-CM | POA: Insufficient documentation

## 2013-09-29 DIAGNOSIS — R519 Headache, unspecified: Secondary | ICD-10-CM

## 2013-09-29 DIAGNOSIS — Z8709 Personal history of other diseases of the respiratory system: Secondary | ICD-10-CM | POA: Insufficient documentation

## 2013-09-29 DIAGNOSIS — Z8619 Personal history of other infectious and parasitic diseases: Secondary | ICD-10-CM | POA: Insufficient documentation

## 2013-09-29 MED ORDER — DIPHENHYDRAMINE HCL 50 MG/ML IJ SOLN
25.0000 mg | Freq: Once | INTRAMUSCULAR | Status: AC
  Administered 2013-09-29: 25 mg via INTRAVENOUS
  Filled 2013-09-29: qty 1

## 2013-09-29 MED ORDER — SODIUM CHLORIDE 0.9 % IV BOLUS (SEPSIS)
1000.0000 mL | INTRAVENOUS | Status: AC
  Administered 2013-09-29: 1000 mL via INTRAVENOUS

## 2013-09-29 MED ORDER — PROCHLORPERAZINE EDISYLATE 5 MG/ML IJ SOLN
10.0000 mg | Freq: Once | INTRAMUSCULAR | Status: AC
  Administered 2013-09-29: 10 mg via INTRAVENOUS
  Filled 2013-09-29: qty 2

## 2013-09-29 MED ORDER — ACETAMINOPHEN 500 MG PO TABS
1000.0000 mg | ORAL_TABLET | Freq: Once | ORAL | Status: AC
  Administered 2013-09-29: 1000 mg via ORAL
  Filled 2013-09-29: qty 2

## 2013-09-29 NOTE — ED Provider Notes (Signed)
CSN: [id number]     Arrival date & time 09/29/13  1719 History   First MD Initiated Contact with Patient 09/29/13 1917     Chief Complaint  Patient presents with  . Headache   (Consider location/radiation/quality/duration/timing/severity/associated sxs/prior Treatment) HPI Pt is a 27yo female with hx of headaches c/o intermittent headache for last 39mo.  States pain normally goes away with tylenol but pt states she feels pressure behind her eyes, occasionally causing her right eyelid to twitch. Pain is 7/10 at this time.Headaches are gradual in onset, aching.  Pt also reports small "knot" on right eyelid, not tender. Denies change in vision or eye drainage.  States she is concerned today as HA has been going on all day, and feels more pressure in front of her head than normal.  Denies change in balance. Denies nausea, numbness, tingling.  Denies head trauma. Has not taken any pain medication over the last 3 days. Denies weight loss. Denies increased stress or recent illness.  Past Medical History  Diagnosis Date  . History of chlamydia infection     Treated  . Trichomonas infection     Treated  . Headache(784.0)   . Chlamydia   . Bronchitis    Past Surgical History  Procedure Laterality Date  . Cosmetic surgery  1997    Facial laceration repair   . No past surgeries     History reviewed. No pertinent family history. History  Substance Use Topics  . Smoking status: Former Smoker -- 5 years    Types: Cigarettes    Quit date: 01/03/2011  . Smokeless tobacco: Never Used  . Alcohol Use: Yes     Comment: occ   OB History   Grav Para Term Preterm Abortions TAB SAB Ect Mult Living   1 1 1  0 0 0 0 0 0 0     Review of Systems  Constitutional: Negative for fever and chills.  Eyes: Positive for photophobia.  Respiratory: Negative for shortness of breath.   Cardiovascular: Negative for chest pain.  Gastrointestinal: Negative for nausea, vomiting, abdominal pain and diarrhea.   Neurological: Positive for headaches. Negative for dizziness, seizures, weakness, light-headedness and numbness.  All other systems reviewed and are negative.    Allergies  Cherry and Food  Home Medications   Current Outpatient Rx  Name  Route  Sig  Dispense  Refill  . acetaminophen (TYLENOL) 500 MG tablet   Oral   Take 1,000 mg by mouth every 6 (six) hours as needed for moderate pain or headache.           BP 131/93  Pulse 63  Temp(Src) 98.7 F (37.1 C) (Oral)  Resp 18  SpO2 95%  LMP 09/17/2013 Physical Exam  Nursing note and vitals reviewed. Constitutional: She is oriented to person, place, and time. She appears well-developed and well-nourished. No distress.  Pt lying comfortably in exam bed, NAD.   HENT:  Head: Normocephalic and atraumatic.  0.5cm lipoma on right eyelid. No evidence of cellulitis.   Eyes: Conjunctivae and EOM are normal. Pupils are equal, round, and reactive to light. Right eye exhibits no discharge. Left eye exhibits no discharge. No scleral icterus.  Neck: Normal range of motion. Neck supple.  No nuchal rigidity or meningeal signs.  Cardiovascular: Normal rate, regular rhythm and normal heart sounds.   Pulmonary/Chest: Effort normal and breath sounds normal. No respiratory distress. She has no wheezes. She has no rales. She exhibits no tenderness.  Abdominal: Soft. Bowel sounds are  normal. She exhibits no distension and no mass. There is no tenderness. There is no rebound and no guarding.  Musculoskeletal: Normal range of motion.  Neurological: She is alert and oriented to person, place, and time. She has normal strength. No cranial nerve deficit or sensory deficit. She displays a negative Romberg sign. GCS eye subscore is 4. GCS verbal subscore is 5. GCS motor subscore is 6.  Skin: Skin is warm and dry. She is not diaphoretic.    ED Course  Procedures (including critical care time) Labs Review Labs Reviewed - No data to display Imaging  Review No results found.  EKG Interpretation   None       MDM   1. Headache   2. Lipoma of face    Pt c/o intermittent headaches x45mo.  No red flag symptoms. Denies change in vision, balance. No numbness or tingling. HA do improve with tylenol. Reports increased pressure today, but has not taken any pain medication PTA. Denies increased stress in her life.    On exam, pt appears well, non-toxic. Normal neuro exam.  Not concerned for [employer], TIA, meningitis. Low concern for mass.  Discussed pt with Dr. Criss Alvine.  As pt only c/o HA relieved by tylenol w/o neurologic findings, no weighloss, numbness, tingling, balance difficulties, will tx HA in ED and refer to neurology and PCP as pt does not have PCP at this time.   May need outpatient MRI if HAs continue to recur.    HA improved with fluids, acetaminophen, compazine and benadryl.  Discussed plan of outpatient f/u with neurology with pt.  Return precautions given. Pt verbalized understanding and agreement with tx plan.   Junius Finner, PA-C 09/29/13 2212

## 2013-09-29 NOTE — ED Provider Notes (Signed)
Medical screening examination/treatment/procedure(s) were performed by non-physician practitioner and as supervising physician I was immediately available for consultation/collaboration.  EKG Interpretation   None         Audree Camel, MD 09/29/13 2359

## 2013-09-29 NOTE — ED Notes (Signed)
Pt c/o intermittent "pressure in head and behind R eye" x "over a couple months" and "knot" on R eye lid x 1 day.  Pain score 7/10.  Pt sts "when I feel the pressure it causes my eye to open up wider than the other.  No dilation, but the actual lid."

## 2013-10-09 IMAGING — US US OB DETAIL+14 WK
1 series · 14 of 28 positions shown · non-contrast
Comparison: none

[Series 1: us ob detail+14 wk · 14 of 64 slices shown]
[im 3/64]
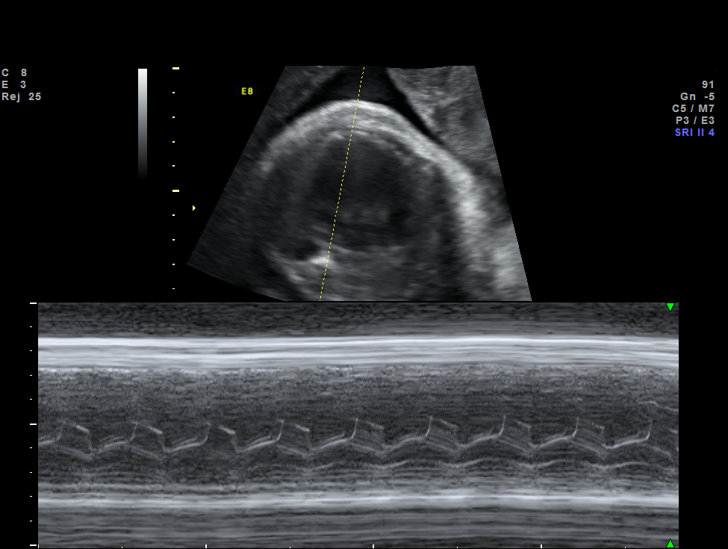
[im 8/64]
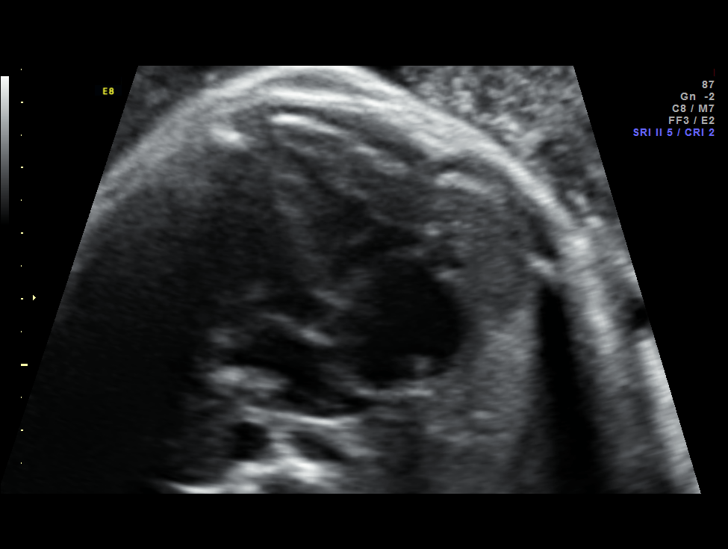
[im 12/64]
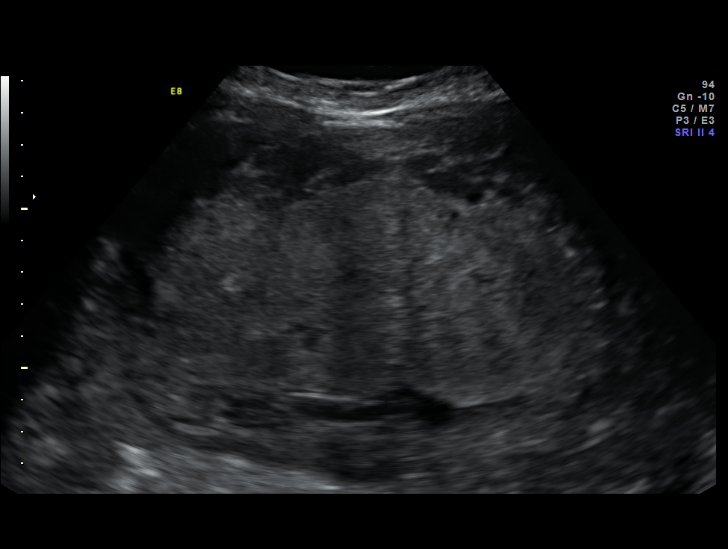
[im 17/64]
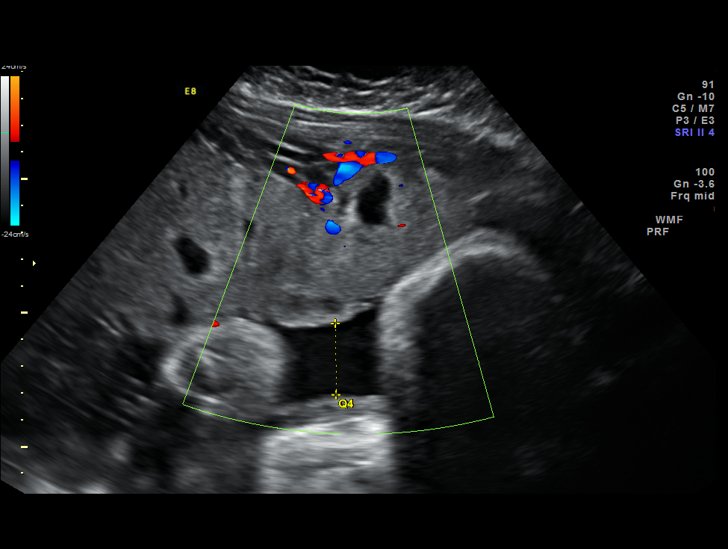
[im 22/64]
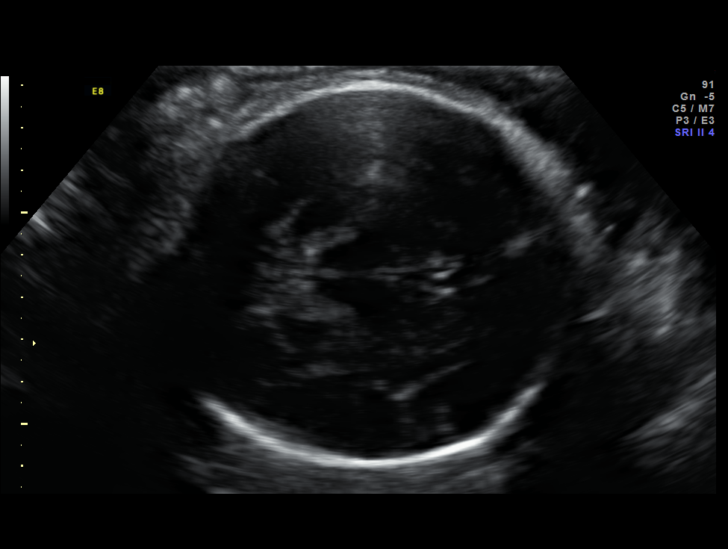
[im 26/64]
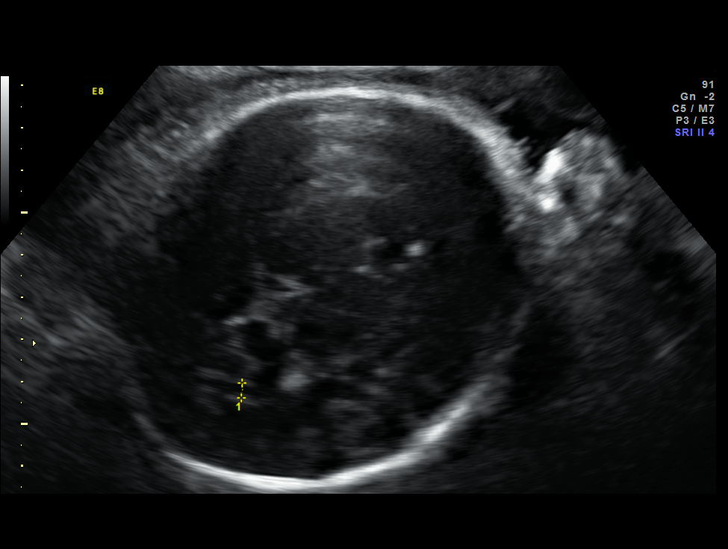
[im 31/64]
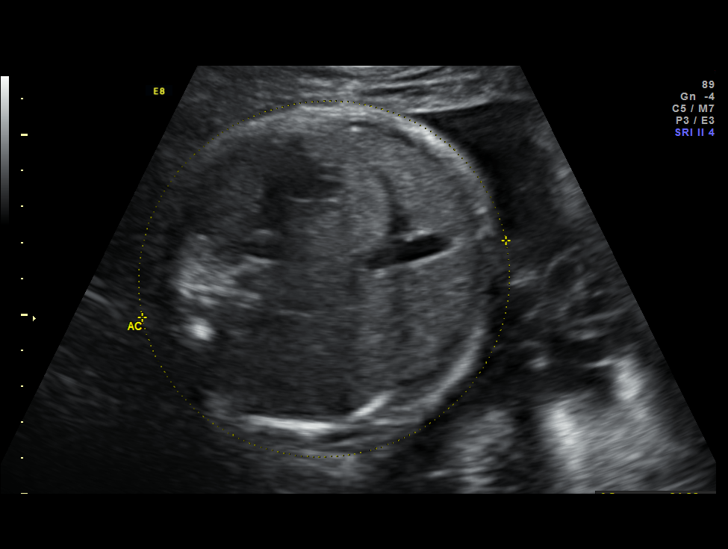
[im 36/64]
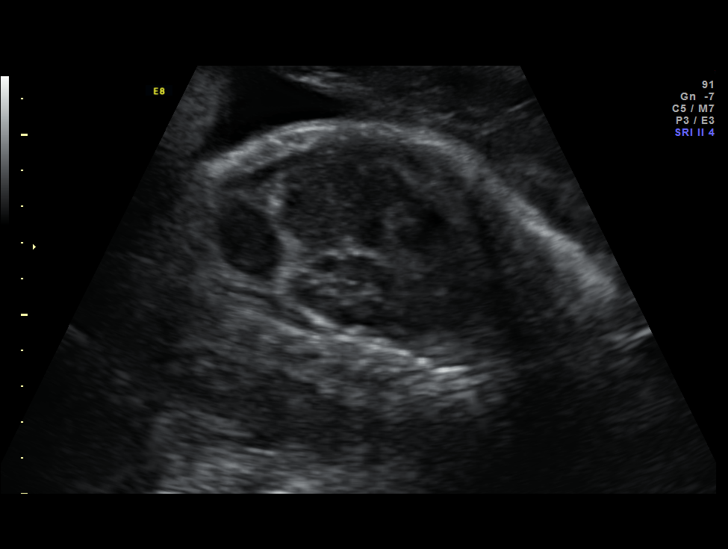
[im 40/64]
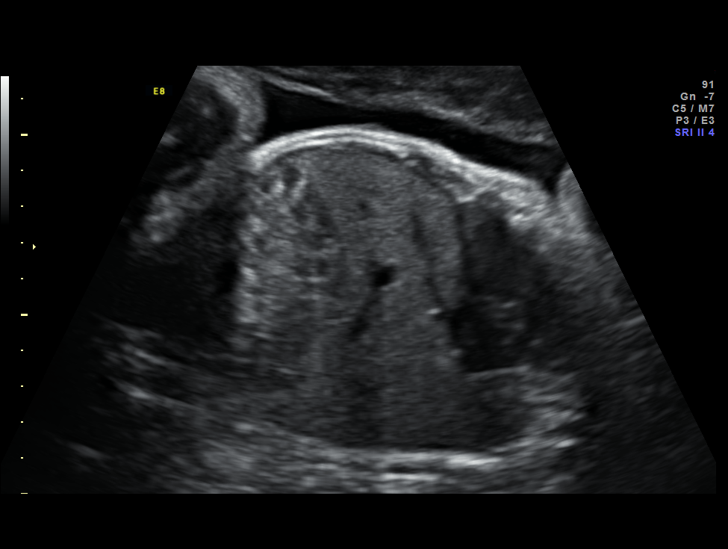
[im 45/64]
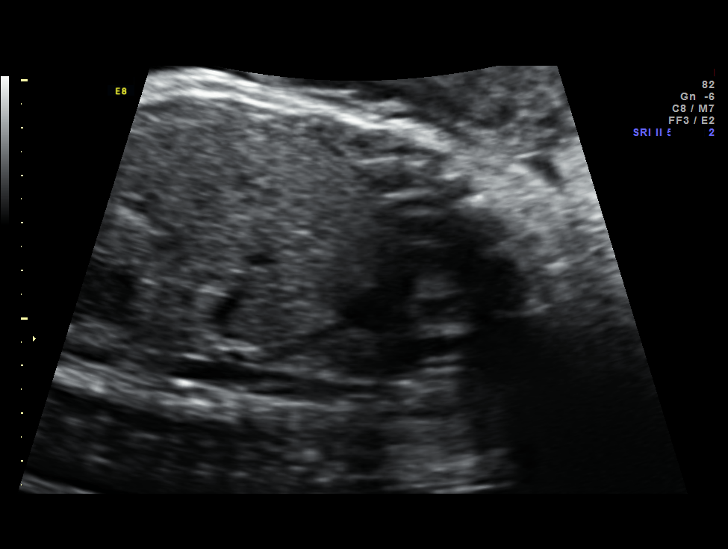
[im 50/64]
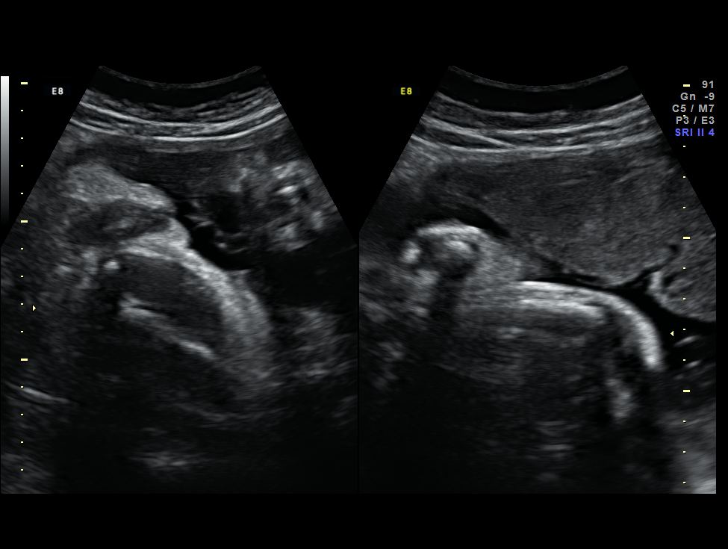
[im 54/64]
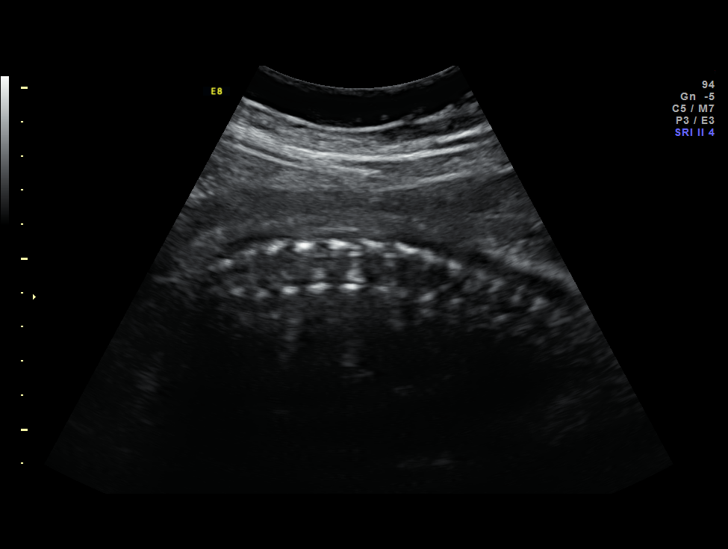
[im 59/64]
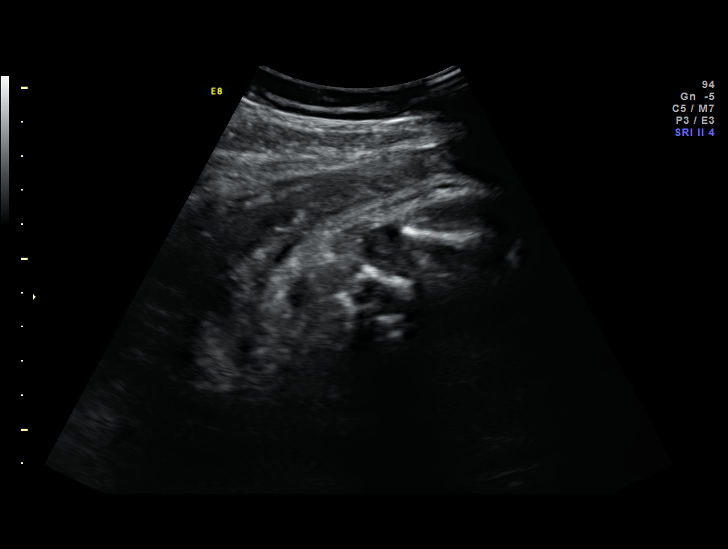
[im 64/64]
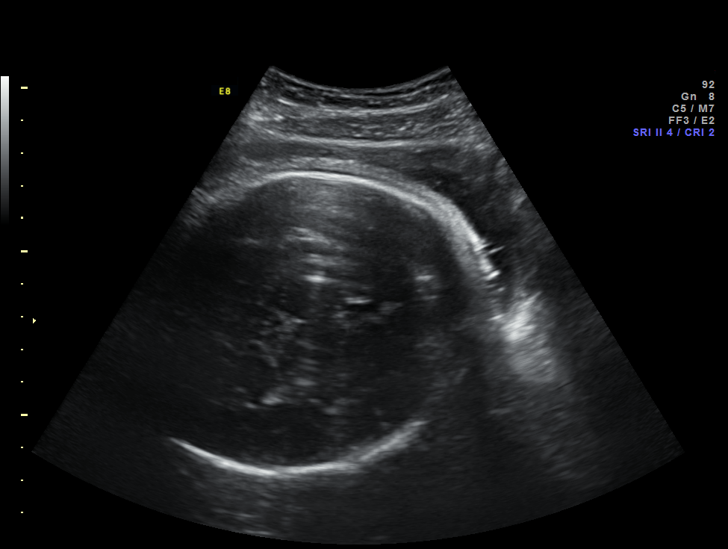

[14 of 28 positions shown; findings below may reference images not displayed]

Canned report from images found in remote index.

Refer to host system for actual result text.

## 2014-03-02 ENCOUNTER — Encounter (HOSPITAL_COMMUNITY): Payer: Self-pay | Admitting: Emergency Medicine

## 2014-03-02 ENCOUNTER — Emergency Department (HOSPITAL_COMMUNITY)
Admission: EM | Admit: 2014-03-02 | Discharge: 2014-03-03 | Disposition: A | Discharge status: Home / Self Care | Payer: Medicaid Other | Attending: Emergency Medicine | Admitting: Emergency Medicine

## 2014-03-02 DIAGNOSIS — K0381 Cracked tooth: Secondary | ICD-10-CM | POA: Insufficient documentation

## 2014-03-02 DIAGNOSIS — Z8709 Personal history of other diseases of the respiratory system: Secondary | ICD-10-CM | POA: Insufficient documentation

## 2014-03-02 DIAGNOSIS — G8929 Other chronic pain: Secondary | ICD-10-CM | POA: Insufficient documentation

## 2014-03-02 DIAGNOSIS — Z8619 Personal history of other infectious and parasitic diseases: Secondary | ICD-10-CM | POA: Insufficient documentation

## 2014-03-02 DIAGNOSIS — Z87891 Personal history of nicotine dependence: Secondary | ICD-10-CM | POA: Insufficient documentation

## 2014-03-02 DIAGNOSIS — K089 Disorder of teeth and supporting structures, unspecified: Secondary | ICD-10-CM | POA: Insufficient documentation

## 2014-03-02 MED ORDER — TRAMADOL HCL 50 MG PO TABS: 50.0000 mg | ORAL_TABLET | Freq: Four times a day (QID) | ORAL | Status: AC | PRN

## 2014-03-02 NOTE — ED Provider Notes (Signed)
CSN: [id number]     Arrival date & time 03/02/14  2227 History  This chart was scribed for Junius Creamer, NP working with Delora Fuel, MD by Roxan Diesel, ED Scribe. This patient was seen in room TR08C/TR08C and the patient's care was started at 11:20 PM.   Chief Complaint  Patient presents with  . Dental Pain    The history is provided by the patient. No language interpreter was used.    HPI Comments: Michelle Kaiser is a 28 y.o. female who presents to the Emergency Department complaining of left-sided lower dental pain that began 2 years ago and worsened today.  Pt states she has a cracked tooth in that area and "this is the worst it's ever been."  She reports constant pain to that area.  She has not made an appointment with a dentist since her pain began.  She states she brushes her teeth 3x/day and uses mouthwash and peroxide.   Past Medical History  Diagnosis Date  . History of chlamydia infection     Treated  . Trichomonas infection     Treated  . Headache(784.0)   . Chlamydia   . Bronchitis     Past Surgical History  Procedure Laterality Date  . Cosmetic surgery  1997    Facial laceration repair   . No past surgeries      No family history on file.   History  Substance Use Topics  . Smoking status: Former Smoker -- 5 years    Types: Cigarettes    Quit date: 01/03/2011  . Smokeless tobacco: Never Used  . Alcohol Use: Yes     Comment: occ    OB History   Grav Para Term Preterm Abortions TAB SAB Ect Mult Living   1 1 1  0 0 0 0 0 0 0       Review of Systems  Constitutional: Negative for fever.  HENT: Positive for dental problem.   All other systems reviewed and are negative.     Allergies  Cherry and Food  Home Medications   Prior to Admission medications   Medication Sig Start Date End Date Taking? Authorizing Provider  acetaminophen (TYLENOL) 500 MG tablet Take 1,000 mg by mouth every 6 (six) hours as needed for moderate pain or headache.      Historical Provider, MD   BP 133/87  Pulse 74  Temp(Src) 97.5 F (36.4 C) (Oral)  Resp 18  Ht 5\' 6"  (1.676 m)  Wt 219 lb (99.338 kg)  BMI 35.36 kg/m2  SpO2 99%  Physical Exam  Nursing note and vitals reviewed. Constitutional: She is oriented to person, place, and time. She appears well-developed and well-nourished. No distress.  HENT:  Head: Normocephalic and atraumatic.  Eyes: EOM are normal.  Neck: Neck supple. No tracheal deviation present.  Cardiovascular: Normal rate.   Pulmonary/Chest: Effort normal. No respiratory distress.  Musculoskeletal: Normal range of motion.  Neurological: She is alert and oriented to person, place, and time.  Skin: Skin is warm and dry.  Psychiatric: She has a normal mood and affect. Her behavior is normal.    ED Course  Procedures (including critical care time)  DIAGNOSTIC STUDIES: Oxygen Saturation is 99% on room air, normal by my interpretation.    COORDINATION OF CARE: 11:23 PM-Discussed treatment plan which includes symptomatic relief and dental referral with pt at bedside and pt agreed to plan.     Labs Review Labs Reviewed - No data to display  Imaging Review No  results found.   EKG Interpretation None      MDM   Final diagnoses:  Chronic dental pain        I personally performed the services described in this documentation, which was scribed in my presence. The recorded information has been reviewed and is accurate.  Garald Balding, NP 03/02/14 [phone]

## 2014-03-02 NOTE — Discharge Instructions (Signed)
Dental Care and Dentist Visits Dental care supports good overall health. Regular dental visits can also help you avoid dental pain, bleeding, infection, and other more serious health problems in the future. It is important to keep the mouth healthy because diseases in the teeth, gums, and other oral tissues can spread to other areas of the body. Some problems, such as diabetes, heart disease, and pre-term labor have been associated with poor oral health.  See your dentist every 6 months. If you experience emergency problems such as a toothache or broken tooth, go to the dentist right away. If you see your dentist regularly, you may catch problems early. It is easier to be treated for problems in the early stages.  WHAT TO EXPECT AT A DENTIST VISIT  Your dentist will look for many common oral health problems and recommend proper treatment. At your regular dental visit, you can expect:  Gentle cleaning of the teeth and gums. This includes scraping and polishing. This helps to remove the sticky substance around the teeth and gums (plaque). Plaque forms in the mouth shortly after eating. Over time, plaque hardens on the teeth as tartar. If tartar is not removed regularly, it can cause problems. Cleaning also helps remove stains.  Periodic X-rays. These pictures of the teeth and supporting bone will help your dentist assess the health of your teeth.  Periodic fluoride treatments. Fluoride is a natural mineral shown to help strengthen teeth. Fluoride treatmentinvolves applying a fluoride gel or varnish to the teeth. It is most commonly done in children.  Examination of the mouth, tongue, jaws, teeth, and gums to look for any oral health problems, such as:  Cavities (dental caries). This is decay on the tooth caused by plaque, sugar, and acid in the mouth. It is best to catch a cavity when it is small.  Inflammation of the gums caused by plaque buildup (gingivitis).  Problems with the mouth or malformed  or misaligned teeth.  Oral cancer or other diseases of the soft tissues or jaws. KEEP YOUR TEETH AND GUMS HEALTHY For healthy teeth and gums, follow these general guidelines as well as your dentist's specific advice:  Have your teeth professionally cleaned at the dentist every 6 months.  Brush twice daily with a fluoride toothpaste.  Floss your teeth daily.  Ask your dentist if you need fluoride supplements, treatments, or fluoride toothpaste.  Eat a healthy diet. Reduce foods and drinks with added sugar.  Avoid smoking. TREATMENT FOR ORAL HEALTH PROBLEMS If you have oral health problems, treatment varies depending on the conditions present in your teeth and gums.  Your caregiver will most likely recommend good oral hygiene at each visit.  For cavities, gingivitis, or other oral health disease, your caregiver will perform a procedure to treat the problem. This is typically done at a separate appointment. Sometimes your caregiver will refer you to another dental specialist for specific tooth problems or for surgery. SEEK IMMEDIATE DENTAL CARE IF:  You have pain, bleeding, or soreness in the gum, tooth, jaw, or mouth area.  A permanent tooth becomes loose or separated from the gum socket.  You experience a blow or injury to the mouth or jaw area. Document Released: 06/14/2011 Document Revised: 12/25/2011 Document Reviewed: 06/14/2011 [facility] Patient Information 2014 [place], [place]. There is no obvious cavity or sign of trauma to your tooth.  There is no gum swelling.  It  is recommended that, you call Dr. Theodosia Blender office first thing in the morning telling them, that you're  referred to the emergency department for further, evaluation.  You'll need an x-ray, and possibly route canal for your persistent pain

## 2014-03-02 NOTE — ED Notes (Signed)
Pt. reports left lower molar pain for 2 years worse today .

## 2014-03-03 MED ORDER — TRAMADOL HCL 50 MG PO TABS
50.0000 mg | ORAL_TABLET | Freq: Once | ORAL | Status: AC
  Administered 2014-03-03: 50 mg via ORAL
  Filled 2014-03-03: qty 1

## 2014-03-03 NOTE — ED Provider Notes (Signed)
Medical screening examination/treatment/procedure(s) were performed by non-physician practitioner and as supervising physician I was immediately available for consultation/collaboration.   Delora Fuel, MD 96/29/52 [id number]

## 2014-08-17 ENCOUNTER — Encounter (HOSPITAL_COMMUNITY): Payer: Self-pay | Admitting: Emergency Medicine

## 2021-05-28 ENCOUNTER — Emergency Department (HOSPITAL_COMMUNITY)
Admission: EM | Admit: 2021-05-28 | Discharge: 2021-05-29 | Disposition: A | Discharge status: Home / Self Care | Payer: BC Managed Care – PPO | Attending: Emergency Medicine | Admitting: Emergency Medicine

## 2021-05-28 ENCOUNTER — Encounter (HOSPITAL_COMMUNITY): Payer: Self-pay | Admitting: Emergency Medicine

## 2021-05-28 DIAGNOSIS — K649 Unspecified hemorrhoids: Secondary | ICD-10-CM

## 2021-05-28 DIAGNOSIS — Z87891 Personal history of nicotine dependence: Secondary | ICD-10-CM | POA: Insufficient documentation

## 2021-05-28 DIAGNOSIS — K648 Other hemorrhoids: Secondary | ICD-10-CM | POA: Diagnosis not present

## 2021-05-28 DIAGNOSIS — K6289 Other specified diseases of anus and rectum: Secondary | ICD-10-CM | POA: Diagnosis present

## 2021-05-28 NOTE — ED Triage Notes (Signed)
Pt here from home with c/o rectal pain , pt thinks that she has a external hemorrhoid , no bleeding noted

## 2021-05-29 MED ORDER — HYDROCORTISONE (PERIANAL) 2.5 % EX CREA: 1.0000 "application " | TOPICAL_CREAM | Freq: Two times a day (BID) | CUTANEOUS | 0 refills | Status: AC

## 2021-05-29 MED ORDER — NITROGLYCERIN 0.4 % RE OINT: 1.0000 "application " | TOPICAL_OINTMENT | Freq: Two times a day (BID) | RECTAL | 0 refills | Status: AC

## 2021-05-29 MED ORDER — BENZOCAINE (TOPICAL) 20 % EX OINT: 1.0000 "application " | TOPICAL_OINTMENT | CUTANEOUS | 0 refills | Status: AC | PRN

## 2021-05-29 NOTE — ED Provider Notes (Signed)
[facility] EMERGENCY DEPARTMENT Provider Note   CSN: [id number] Arrival date & time: 05/28/21  [id number]     History No chief complaint on file.   Michelle Kaiser is a 35 y.o. female.  HPI     35 year old female with history of internal hemorrhoids that have required banding, presents to concern for rectal pain.  Reports that for the last 2 days she has had significant swelling and pain to an area around her rectum.  She has not yet tried anything for it.  Denies any associated fevers, nausea, vomiting or abdominal pain.  She has had diarrhea for the last 2 days.  Has history of internal hemorrhoids that have required banding through the gastroenterologist, but has never had external hemorrhoids or symptomatic external hemorrhoids before.  The pain is severe, worse with BM.  Past Medical History:  Diagnosis Date   Bronchitis    Chlamydia    Headache(784.0)    History of chlamydia infection    Treated   Trichomonas infection    Treated    Patient Active Problem List   Diagnosis Date Noted   Active labor 08/21/2011   Normal delivery 08/21/2011    Past Surgical History:  Procedure Laterality Date   COSMETIC SURGERY  1997   Facial laceration repair    NO PAST SURGERIES       OB History     Gravida  1   Para  1   Term  1   Preterm  0   AB  0   Living  0      SAB  0   IAB  0   Ectopic  0   Multiple  0   Live Births              No family history on file.  Social History   Tobacco Use   Smoking status: Former    Years: 5.00    Types: Cigarettes    Quit date: 01/03/2011    Years since quitting: 10.4   Smokeless tobacco: Never  Substance Use Topics   Alcohol use: Yes    Comment: occ   Drug use: Yes    Frequency: 7.0 times per week    Types: Marijuana    Home Medications Prior to Admission medications   Medication Sig Start Date End Date Taking? Authorizing Provider  Benzocaine 20 % OINT Apply 1 application topically  every 4 (four) hours as needed (sparingly up to 6 times per day for pain). 05/29/21  Yes Gareth Morgan, MD  hydrocortisone (ANUSOL-HC) 2.5 % rectal cream Place 1 application rectally 2 (two) times daily. Apply sparingly up to 2 times daily. Use no more than 7 days. 05/29/21  Yes Gareth Morgan, MD  Nitroglycerin 0.4 % OINT Place 1 application rectally 2 (two) times daily. 05/29/21  Yes Gareth Morgan, MD  ibuprofen (ADVIL,MOTRIN) 200 MG tablet Take 800 mg by mouth every 4 (four) hours as needed for mild pain.    [provider]  traMADol (ULTRAM) 50 MG tablet Take 1 tablet (50 mg total) by mouth every 6 (six) hours as needed. 03/02/14   Junius Creamer, NP    Allergies    [employer] and Food  Review of Systems   Review of Systems  Constitutional:  Negative for fever.  Gastrointestinal:  Positive for diarrhea and rectal pain. Negative for abdominal pain, constipation, nausea and vomiting.  Genitourinary:  Negative for dysuria.  Skin:  Negative for rash.   Physical Exam  Updated Vital Signs BP 121/85   Pulse (!) 58   Temp 98.6 F (37 C) (Oral)   Resp 14   SpO2 100%   Physical Exam Vitals and nursing note reviewed. Exam conducted with a chaperone present.  Constitutional:      General: She is not in acute distress.    Appearance: Normal appearance. She is not ill-appearing, toxic-appearing or diaphoretic.  HENT:     Head: Normocephalic.  Eyes:     Conjunctiva/sclera: Conjunctivae normal.  Cardiovascular:     Rate and Rhythm: Normal rate and regular rhythm.     Pulses: Normal pulses.  Pulmonary:     Effort: Pulmonary effort is normal. No respiratory distress.  Genitourinary:    Rectum: External hemorrhoid present.  Musculoskeletal:        General: No deformity or signs of injury.     Cervical back: No rigidity.  Skin:    General: Skin is warm and dry.     Coloration: Skin is not jaundiced or pale.  Neurological:     General: No focal deficit present.     Mental  Status: She is alert and oriented to person, place, and time.    ED Results / Procedures / Treatments   Labs (all labs ordered are listed, but only abnormal results are displayed) Labs Reviewed - No data to display  EKG None  Radiology No results found.  Procedures Procedures   Medications Ordered in ED Medications - No data to display  ED Course  I have reviewed the triage vital signs and the nursing notes.  Pertinent labs & imaging results that were available during my care of the patient were reviewed by me and considered in my medical decision making (see chart for details).    MDM Rules/Calculators/A&P                            35 year old female with history of internal hemorrhoids that have required banding, presents to concern for rectal pain.  Exam is consistent with a hemorrhoid.  Does not have the appearance of being fully thrombosed at this time, however is acutely inflamed and possible early thrombosis.  Exam is not consistent with abscess or cellulitis.  Recommend sitz bath's, given prescription for hydrocortisone, nitroglycerin ointment, and benzocaine.  Recommend outpt follow up. Patient discharged in stable condition with understanding of reasons to return.   Final Clinical Impression(s) / ED Diagnoses Final diagnoses:  Acute hemorrhoid    Rx / DC Orders ED Discharge Orders          Ordered    Benzocaine 20 % OINT  Every 4 hours PRN        05/29/21 0853    hydrocortisone (ANUSOL-HC) 2.5 % rectal cream  2 times daily        05/29/21 0853    Nitroglycerin 0.4 % OINT  2 times daily        05/29/21 [id number]             Gareth Morgan, MD 05/29/21 1117

## 2022-08-10 DIAGNOSIS — B9689 Other specified bacterial agents as the cause of diseases classified elsewhere: Secondary | ICD-10-CM | POA: Diagnosis not present

## 2022-08-10 DIAGNOSIS — N83291 Other ovarian cyst, right side: Secondary | ICD-10-CM | POA: Diagnosis not present

## 2022-08-10 DIAGNOSIS — R9389 Abnormal findings on diagnostic imaging of other specified body structures: Secondary | ICD-10-CM | POA: Diagnosis not present

## 2022-08-10 DIAGNOSIS — R87610 Atypical squamous cells of undetermined significance on cytologic smear of cervix (ASC-US): Secondary | ICD-10-CM | POA: Diagnosis not present

## 2022-08-10 DIAGNOSIS — N921 Excessive and frequent menstruation with irregular cycle: Secondary | ICD-10-CM | POA: Diagnosis not present

## 2022-08-10 DIAGNOSIS — Z113 Encounter for screening for infections with a predominantly sexual mode of transmission: Secondary | ICD-10-CM | POA: Diagnosis not present

## 2022-08-10 DIAGNOSIS — Z3202 Encounter for pregnancy test, result negative: Secondary | ICD-10-CM | POA: Diagnosis not present

## 2022-08-10 DIAGNOSIS — Z01419 Encounter for gynecological examination (general) (routine) without abnormal findings: Secondary | ICD-10-CM | POA: Diagnosis not present

## 2022-08-10 DIAGNOSIS — N920 Excessive and frequent menstruation with regular cycle: Secondary | ICD-10-CM | POA: Diagnosis not present

## 2022-08-10 DIAGNOSIS — N761 Subacute and chronic vaginitis: Secondary | ICD-10-CM | POA: Diagnosis not present

## 2022-08-10 DIAGNOSIS — Z1151 Encounter for screening for human papillomavirus (HPV): Secondary | ICD-10-CM | POA: Diagnosis not present

## 2022-09-05 DIAGNOSIS — Z1329 Encounter for screening for other suspected endocrine disorder: Secondary | ICD-10-CM | POA: Diagnosis not present

## 2022-09-05 DIAGNOSIS — Z0000 Encounter for general adult medical examination without abnormal findings: Secondary | ICD-10-CM | POA: Diagnosis not present

## 2022-10-10 DIAGNOSIS — Z3202 Encounter for pregnancy test, result negative: Secondary | ICD-10-CM | POA: Diagnosis not present

## 2022-10-10 DIAGNOSIS — D260 Other benign neoplasm of cervix uteri: Secondary | ICD-10-CM | POA: Diagnosis not present

## 2022-10-10 DIAGNOSIS — N72 Inflammatory disease of cervix uteri: Secondary | ICD-10-CM | POA: Diagnosis not present

## 2022-10-10 DIAGNOSIS — R87610 Atypical squamous cells of undetermined significance on cytologic smear of cervix (ASC-US): Secondary | ICD-10-CM | POA: Diagnosis not present

## 2022-10-10 DIAGNOSIS — R87810 Cervical high risk human papillomavirus (HPV) DNA test positive: Secondary | ICD-10-CM | POA: Diagnosis not present

## 2022-10-20 DIAGNOSIS — Z202 Contact with and (suspected) exposure to infections with a predominantly sexual mode of transmission: Secondary | ICD-10-CM | POA: Diagnosis not present

## 2023-03-05 DIAGNOSIS — R399 Unspecified symptoms and signs involving the genitourinary system: Secondary | ICD-10-CM | POA: Diagnosis not present

## 2023-03-05 DIAGNOSIS — Z113 Encounter for screening for infections with a predominantly sexual mode of transmission: Secondary | ICD-10-CM | POA: Diagnosis not present

## 2023-03-05 DIAGNOSIS — B9689 Other specified bacterial agents as the cause of diseases classified elsewhere: Secondary | ICD-10-CM | POA: Diagnosis not present

## 2023-03-05 DIAGNOSIS — N761 Subacute and chronic vaginitis: Secondary | ICD-10-CM | POA: Diagnosis not present

## 2023-03-05 DIAGNOSIS — R8290 Unspecified abnormal findings in urine: Secondary | ICD-10-CM | POA: Diagnosis not present

## 2023-04-12 DIAGNOSIS — E569 Vitamin deficiency, unspecified: Secondary | ICD-10-CM | POA: Diagnosis not present

## 2023-04-12 DIAGNOSIS — R252 Cramp and spasm: Secondary | ICD-10-CM | POA: Diagnosis not present

## 2023-04-12 DIAGNOSIS — M6289 Other specified disorders of muscle: Secondary | ICD-10-CM | POA: Diagnosis not present

## 2023-04-12 DIAGNOSIS — Z1329 Encounter for screening for other suspected endocrine disorder: Secondary | ICD-10-CM | POA: Diagnosis not present

## 2023-04-16 DIAGNOSIS — F913 Oppositional defiant disorder: Secondary | ICD-10-CM | POA: Diagnosis not present

## 2023-04-25 DIAGNOSIS — N760 Acute vaginitis: Secondary | ICD-10-CM | POA: Diagnosis not present

## 2023-04-25 DIAGNOSIS — Z113 Encounter for screening for infections with a predominantly sexual mode of transmission: Secondary | ICD-10-CM | POA: Diagnosis not present

## 2023-04-25 DIAGNOSIS — Z3202 Encounter for pregnancy test, result negative: Secondary | ICD-10-CM | POA: Diagnosis not present

## 2023-05-02 DIAGNOSIS — F913 Oppositional defiant disorder: Secondary | ICD-10-CM | POA: Diagnosis not present

## 2023-05-31 DIAGNOSIS — N760 Acute vaginitis: Secondary | ICD-10-CM | POA: Diagnosis not present

## 2023-05-31 DIAGNOSIS — B9689 Other specified bacterial agents as the cause of diseases classified elsewhere: Secondary | ICD-10-CM | POA: Diagnosis not present

## 2023-07-13 DIAGNOSIS — K130 Diseases of lips: Secondary | ICD-10-CM | POA: Diagnosis not present

## 2023-09-01 DIAGNOSIS — N898 Other specified noninflammatory disorders of vagina: Secondary | ICD-10-CM | POA: Diagnosis not present

## 2023-09-01 DIAGNOSIS — Z113 Encounter for screening for infections with a predominantly sexual mode of transmission: Secondary | ICD-10-CM | POA: Diagnosis not present

## 2023-11-06 DIAGNOSIS — Z01411 Encounter for gynecological examination (general) (routine) with abnormal findings: Secondary | ICD-10-CM | POA: Diagnosis not present

## 2023-11-06 DIAGNOSIS — Z8742 Personal history of other diseases of the female genital tract: Secondary | ICD-10-CM | POA: Diagnosis not present

## 2023-11-06 DIAGNOSIS — Z124 Encounter for screening for malignant neoplasm of cervix: Secondary | ICD-10-CM | POA: Diagnosis not present

## 2023-11-06 DIAGNOSIS — N760 Acute vaginitis: Secondary | ICD-10-CM | POA: Diagnosis not present

## 2023-11-06 DIAGNOSIS — B9689 Other specified bacterial agents as the cause of diseases classified elsewhere: Secondary | ICD-10-CM | POA: Diagnosis not present

## 2023-11-06 DIAGNOSIS — Z1151 Encounter for screening for human papillomavirus (HPV): Secondary | ICD-10-CM | POA: Diagnosis not present

## 2023-12-07 DIAGNOSIS — N6310 Unspecified lump in the right breast, unspecified quadrant: Secondary | ICD-10-CM | POA: Diagnosis not present

## 2023-12-07 DIAGNOSIS — N6314 Unspecified lump in the right breast, lower inner quadrant: Secondary | ICD-10-CM | POA: Diagnosis not present

## 2023-12-07 DIAGNOSIS — R92323 Mammographic fibroglandular density, bilateral breasts: Secondary | ICD-10-CM | POA: Diagnosis not present

## 2023-12-26 DIAGNOSIS — K130 Diseases of lips: Secondary | ICD-10-CM | POA: Diagnosis not present

## 2024-01-02 DIAGNOSIS — F419 Anxiety disorder, unspecified: Secondary | ICD-10-CM | POA: Diagnosis not present

## 2024-01-07 DIAGNOSIS — E6609 Other obesity due to excess calories: Secondary | ICD-10-CM | POA: Diagnosis not present

## 2024-01-07 DIAGNOSIS — Z0000 Encounter for general adult medical examination without abnormal findings: Secondary | ICD-10-CM | POA: Diagnosis not present

## 2024-01-07 DIAGNOSIS — Z6836 Body mass index (BMI) 36.0-36.9, adult: Secondary | ICD-10-CM | POA: Diagnosis not present

## 2024-01-07 DIAGNOSIS — Z23 Encounter for immunization: Secondary | ICD-10-CM | POA: Diagnosis not present

## 2024-01-07 DIAGNOSIS — Z713 Dietary counseling and surveillance: Secondary | ICD-10-CM | POA: Diagnosis not present

## 2024-01-07 DIAGNOSIS — Z130 Encounter for screening for diseases of the blood and blood-forming organs and certain disorders involving the immune mechanism: Secondary | ICD-10-CM | POA: Diagnosis not present

## 2024-01-07 DIAGNOSIS — Z13220 Encounter for screening for lipoid disorders: Secondary | ICD-10-CM | POA: Diagnosis not present

## 2024-01-07 DIAGNOSIS — Z1329 Encounter for screening for other suspected endocrine disorder: Secondary | ICD-10-CM | POA: Diagnosis not present

## 2024-01-07 DIAGNOSIS — E66812 Obesity, class 2: Secondary | ICD-10-CM | POA: Diagnosis not present

## 2024-01-23 DIAGNOSIS — F419 Anxiety disorder, unspecified: Secondary | ICD-10-CM | POA: Diagnosis not present

## 2024-01-29 DIAGNOSIS — F419 Anxiety disorder, unspecified: Secondary | ICD-10-CM | POA: Diagnosis not present

## 2024-02-05 DIAGNOSIS — F419 Anxiety disorder, unspecified: Secondary | ICD-10-CM | POA: Diagnosis not present

## 2024-02-14 DIAGNOSIS — N898 Other specified noninflammatory disorders of vagina: Secondary | ICD-10-CM | POA: Diagnosis not present

## 2024-02-14 DIAGNOSIS — F419 Anxiety disorder, unspecified: Secondary | ICD-10-CM | POA: Diagnosis not present

## 2024-02-14 DIAGNOSIS — N760 Acute vaginitis: Secondary | ICD-10-CM | POA: Diagnosis not present

## 2024-02-14 DIAGNOSIS — Z3202 Encounter for pregnancy test, result negative: Secondary | ICD-10-CM | POA: Diagnosis not present

## 2024-02-14 DIAGNOSIS — B9689 Other specified bacterial agents as the cause of diseases classified elsewhere: Secondary | ICD-10-CM | POA: Diagnosis not present

## 2024-02-26 DIAGNOSIS — F419 Anxiety disorder, unspecified: Secondary | ICD-10-CM | POA: Diagnosis not present

## 2024-03-13 DIAGNOSIS — F419 Anxiety disorder, unspecified: Secondary | ICD-10-CM | POA: Diagnosis not present

## 2024-03-26 DIAGNOSIS — F419 Anxiety disorder, unspecified: Secondary | ICD-10-CM | POA: Diagnosis not present

## 2024-04-10 DIAGNOSIS — F419 Anxiety disorder, unspecified: Secondary | ICD-10-CM | POA: Diagnosis not present

## 2024-04-23 DIAGNOSIS — F419 Anxiety disorder, unspecified: Secondary | ICD-10-CM | POA: Diagnosis not present

## 2024-05-01 DIAGNOSIS — N760 Acute vaginitis: Secondary | ICD-10-CM | POA: Diagnosis not present

## 2024-05-01 DIAGNOSIS — N898 Other specified noninflammatory disorders of vagina: Secondary | ICD-10-CM | POA: Diagnosis not present

## 2024-05-07 DIAGNOSIS — F419 Anxiety disorder, unspecified: Secondary | ICD-10-CM | POA: Diagnosis not present

## 2024-05-20 DIAGNOSIS — F419 Anxiety disorder, unspecified: Secondary | ICD-10-CM | POA: Diagnosis not present

## 2024-06-10 DIAGNOSIS — F419 Anxiety disorder, unspecified: Secondary | ICD-10-CM | POA: Diagnosis not present

## 2024-06-17 DIAGNOSIS — N898 Other specified noninflammatory disorders of vagina: Secondary | ICD-10-CM | POA: Diagnosis not present

## 2024-06-17 DIAGNOSIS — N760 Acute vaginitis: Secondary | ICD-10-CM | POA: Diagnosis not present

## 2024-07-09 DIAGNOSIS — F419 Anxiety disorder, unspecified: Secondary | ICD-10-CM | POA: Diagnosis not present

## 2024-07-26 DIAGNOSIS — F419 Anxiety disorder, unspecified: Secondary | ICD-10-CM | POA: Diagnosis not present
